# Patient Record
Sex: Female | Born: 1940 | Race: Black or African American | Hispanic: No | Marital: Single | Smoking: Former smoker
Health system: Southern US, Community
[De-identification: ages and names within clinical notes are randomized; demographics above are authoritative.]

## PROBLEM LIST (undated history)

## (undated) DIAGNOSIS — I10 Essential (primary) hypertension: Secondary | ICD-10-CM

## (undated) DIAGNOSIS — E119 Type 2 diabetes mellitus without complications: Secondary | ICD-10-CM

## (undated) HISTORY — PX: BLADDER SURGERY: SHX569

## (undated) HISTORY — PX: ABDOMINAL HYSTERECTOMY: SHX81

---

## 2004-03-17 ENCOUNTER — Other Ambulatory Visit: Payer: Self-pay

## 2004-03-17 ENCOUNTER — Emergency Department: Payer: Self-pay | Admitting: Emergency Medicine

## 2006-12-13 ENCOUNTER — Ambulatory Visit: Payer: Self-pay | Admitting: Family Medicine

## 2007-12-15 ENCOUNTER — Ambulatory Visit: Payer: Self-pay | Admitting: Family Medicine

## 2008-07-24 ENCOUNTER — Ambulatory Visit: Payer: Self-pay | Admitting: Obstetrics and Gynecology

## 2008-07-30 ENCOUNTER — Inpatient Hospital Stay: Payer: Self-pay | Admitting: Obstetrics and Gynecology

## 2009-03-13 ENCOUNTER — Ambulatory Visit: Payer: Self-pay | Admitting: Family Medicine

## 2010-02-19 ENCOUNTER — Ambulatory Visit: Payer: Self-pay | Admitting: Gastroenterology

## 2014-08-25 ENCOUNTER — Emergency Department
Admission: EM | Admit: 2014-08-25 | Discharge: 2014-08-25 | Disposition: A | Discharge status: Home / Self Care | Payer: Medicare HMO | Attending: Emergency Medicine | Admitting: Emergency Medicine

## 2014-08-25 ENCOUNTER — Encounter: Payer: Self-pay | Admitting: Emergency Medicine

## 2014-08-25 DIAGNOSIS — I10 Essential (primary) hypertension: Secondary | ICD-10-CM | POA: Insufficient documentation

## 2014-08-25 DIAGNOSIS — Z79899 Other long term (current) drug therapy: Secondary | ICD-10-CM | POA: Diagnosis not present

## 2014-08-25 DIAGNOSIS — I119 Hypertensive heart disease without heart failure: Secondary | ICD-10-CM | POA: Diagnosis not present

## 2014-08-25 DIAGNOSIS — M791 Myalgia: Secondary | ICD-10-CM | POA: Diagnosis present

## 2014-08-25 DIAGNOSIS — R21 Rash and other nonspecific skin eruption: Secondary | ICD-10-CM | POA: Diagnosis not present

## 2014-08-25 DIAGNOSIS — Z792 Long term (current) use of antibiotics: Secondary | ICD-10-CM | POA: Insufficient documentation

## 2014-08-25 DIAGNOSIS — Z87891 Personal history of nicotine dependence: Secondary | ICD-10-CM | POA: Insufficient documentation

## 2014-08-25 HISTORY — DX: Essential (primary) hypertension: I10

## 2014-08-25 HISTORY — DX: Type 2 diabetes mellitus without complications: E11.9

## 2014-08-25 MED ORDER — MUPIROCIN CALCIUM 2 % EX CREA: TOPICAL_CREAM | CUTANEOUS | Status: DC

## 2014-08-25 MED ORDER — SULFAMETHOXAZOLE-TRIMETHOPRIM 800-160 MG PO TABS: 1.0000 | ORAL_TABLET | Freq: Two times a day (BID) | ORAL | Status: DC

## 2014-08-25 NOTE — ED Provider Notes (Signed)
[facility] Emergency Department Provider Note  ____________________________________________  Time seen: Approximately 10:32 AM  I have reviewed the triage vital signs and the nursing notes.   HISTORY  Chief Complaint Wound Check    HPI Jaime Castro is a 74 y.o. female is here today with complaint of a sore around her buttocks. She states that she has an area that was hurting and used a "pain patch" that was prescribed for her for another part of her body. She states this is probably a bad idea. After that she has noticed a sore that is open for approximately 1 week. Pain currently is a 10 out of 10. Rubbing it makes it worse. She has not found anything that makes it feel better. She denies any fever chills nausea vomiting.   Past Medical History  Diagnosis Date  . Hypertension   . Diabetes mellitus without complication     There are no active problems to display for this patient.   No past surgical history on file.  Current Outpatient Rx  Name  Route  Sig  Dispense  Refill  . amLODipine (NORVASC) 10 MG tablet   Oral   Take 10 mg by mouth daily.         Marland Kitchen atorvastatin (LIPITOR) 80 MG tablet   Oral   Take 80 mg by mouth daily.         . cholecalciferol (VITAMIN D) 1000 UNITS tablet   Oral   Take 1,000 Units by mouth daily.         Marland Kitchen lisinopril-hydrochlorothiazide (PRINZIDE,ZESTORETIC) 20-25 MG per tablet   Oral   Take 1 tablet by mouth daily.         . metFORMIN (GLUMETZA) 500 MG (MOD) 24 hr tablet   Oral   Take 500 mg by mouth 2 (two) times daily with a meal.         . mupirocin cream (BACTROBAN) 2 %      Apply to affected area 3 times daily   30 g   0   . sulfamethoxazole-trimethoprim (BACTRIM DS,SEPTRA DS) 800-160 MG per tablet   Oral   Take 1 tablet by mouth 2 (two) times daily.   20 tablet   0     Allergies Review of patient's allergies indicates no known allergies.  No family history on file.  Social  History History  Substance Use Topics  . Smoking status: Former Games developer  . Smokeless tobacco: Not on file  . Alcohol Use: No    Review of Systems Constitutional: No fever/chills Eyes: No visual changes. ENT: No sore throat. Cardiovascular: Denies chest pain. Respiratory: Denies shortness of breath. Gastrointestinal: No abdominal pain.  No nausea, no vomiting.  No diarrhea.  No constipation. Genitourinary: Negative for dysuria. Musculoskeletal: Negative for back pain. Skin: Negative for rash. Neurological: Negative for headaches, focal weakness or numbness.  10-point ROS otherwise negative.  ____________________________________________   PHYSICAL EXAM:  VITAL SIGNS: ED Triage Vitals  Enc Vitals Group     BP 08/25/14 1004 164/70 mmHg     Pulse Rate 08/25/14 1004 70     Resp 08/25/14 1004 20     Temp 08/25/14 1004 97.7 F (36.5 C)     Temp Source 08/25/14 1004 Oral     SpO2 08/25/14 1004 99 %     Weight 08/25/14 1004 145 lb (65.772 kg)     Height 08/25/14 1004  (1.575 m)     Head Cir --  Peak Flow --      Pain Score 08/25/14 1005 10     Pain Loc --      Pain Edu? --      Excl. in GC? --     Constitutional: Alert and oriented. Well appearing and in no acute distress. Eyes: Conjunctivae are normal. PERRL. EOMI. Head: Atraumatic. Nose: No congestion/rhinnorhea. Cardiovascular: Normal rate, regular rhythm. Grossly normal heart sounds.  Good peripheral circulation. Respiratory: Normal respiratory effort.  No retractions. Lungs CTAB. Gastrointestinal: Soft and nontender. No distention. No abdominal bruits. No CVA tenderness. Musculoskeletal: No lower extremity tenderness nor edema.  No joint effusions. Neurologic:  Normal speech and language. No gross focal neurologic deficits are appreciated. Speech is normal. No gait instability. Skin:  Skin is warm, dry. There is 2 small open areas approximately 1 cm on the right upper buttocks. There is no drainage. These  are very superficial in appearance. There is no tenderness on palpation, no warmth or sensation of a cyst. Psychiatric: Mood and affect are normal. Speech and behavior are normal.  ____________________________________________   LABS (all labs ordered are listed, but only abnormal results are displayed)  Labs Reviewed - No data to display PROCEDURES  Procedure(s) performed: None  Critical Care performed: No  ____________________________________________   INITIAL IMPRESSION / ASSESSMENT AND PLAN / ED COURSE  Pertinent labs & imaging results that were available during my care of the patient were reviewed by me and considered in my medical decision making (see chart for details).  Patient was given a prescription for Septra DS and also Bactroban. She is to follow-up with her doctor next week or return to the emergency room sooner if any severe worsening. ____________________________________________   FINAL CLINICAL IMPRESSION(S) / ED DIAGNOSES  Final diagnoses:  Skin eruption      Tommi RumpsRhonda L Summers, PA-C 08/25/14 1050  Jene Everyobert Kinner, MD 08/25/14 1423

## 2014-08-25 NOTE — ED Notes (Signed)
States has had sore by coccyx x 1 week, noted small open sore, states has been on both sides

## 2015-11-04 ENCOUNTER — Other Ambulatory Visit: Payer: Self-pay | Admitting: Family Medicine

## 2015-11-04 DIAGNOSIS — Z1231 Encounter for screening mammogram for malignant neoplasm of breast: Secondary | ICD-10-CM

## 2015-11-20 ENCOUNTER — Other Ambulatory Visit: Payer: Self-pay | Admitting: Family Medicine

## 2015-11-20 ENCOUNTER — Ambulatory Visit
Admission: RE | Admit: 2015-11-20 | Discharge: 2015-11-20 | Disposition: A | Discharge status: Home / Self Care | Payer: Medicare HMO | Source: Ambulatory Visit | Attending: Family Medicine | Admitting: Family Medicine

## 2015-11-20 DIAGNOSIS — Z1231 Encounter for screening mammogram for malignant neoplasm of breast: Secondary | ICD-10-CM | POA: Diagnosis present

## 2017-11-21 ENCOUNTER — Emergency Department
Admission: EM | Admit: 2017-11-21 | Discharge: 2017-11-21 | Disposition: A | Discharge status: Home / Self Care | Payer: Medicare HMO | Attending: Student in an Organized Health Care Education/Training Program | Admitting: Student in an Organized Health Care Education/Training Program

## 2017-11-21 ENCOUNTER — Other Ambulatory Visit: Payer: Self-pay

## 2017-11-21 ENCOUNTER — Emergency Department: Payer: Medicare HMO

## 2017-11-21 DIAGNOSIS — Z79899 Other long term (current) drug therapy: Secondary | ICD-10-CM | POA: Diagnosis not present

## 2017-11-21 DIAGNOSIS — Z7984 Long term (current) use of oral hypoglycemic drugs: Secondary | ICD-10-CM | POA: Insufficient documentation

## 2017-11-21 DIAGNOSIS — Z87891 Personal history of nicotine dependence: Secondary | ICD-10-CM | POA: Diagnosis not present

## 2017-11-21 DIAGNOSIS — M25552 Pain in left hip: Secondary | ICD-10-CM | POA: Diagnosis not present

## 2017-11-21 DIAGNOSIS — I10 Essential (primary) hypertension: Secondary | ICD-10-CM | POA: Insufficient documentation

## 2017-11-21 DIAGNOSIS — E119 Type 2 diabetes mellitus without complications: Secondary | ICD-10-CM | POA: Insufficient documentation

## 2017-11-21 MED ORDER — MELOXICAM 15 MG PO TABS: 15.0000 mg | ORAL_TABLET | Freq: Every day | ORAL | 2 refills | Status: AC

## 2017-11-21 MED ORDER — KETOROLAC TROMETHAMINE 30 MG/ML IJ SOLN
30.0000 mg | Freq: Once | INTRAMUSCULAR | Status: AC
  Administered 2017-11-21: 30 mg via INTRAMUSCULAR
  Filled 2017-11-21: qty 1

## 2017-11-21 NOTE — Discharge Instructions (Addendum)
Follow-up with Dr. Martha ClanKrasinski if not better 5 7 days.  Return emergency department for worsening.  Take medication as needed for pain.  You may also apply heat or ice to the area.

## 2017-11-21 NOTE — ED Triage Notes (Signed)
Pt arrives to ED c/o of L hip and L leg pain that began last night. Denies hx sciatica. Alert, oriented, ambulatory.

## 2017-11-21 NOTE — ED Provider Notes (Signed)
[facility] Emergency Department Provider Note  ____________________________________________   First MD Initiated Contact with Patient 11/21/17 1516     (approximate)  I have reviewed the triage vital signs and the nursing notes.   HISTORY  Chief Complaint Hip Pain and Leg Pain    HPI Jaime Castro is a 77 y.o. female complaining of left hip pain and leg pain.  States pain began last night.  She denies any injuries.   No history of leg or back pain.  Patient is unsure if she has arthritis or not.  She denies numbness or tingling   Past Medical History:  Diagnosis Date  . Diabetes mellitus without complication (HCC)   . Hypertension     There are no active problems to display for this patient.   Past Surgical History:  Procedure Laterality Date  . ABDOMINAL HYSTERECTOMY    . BLADDER SURGERY      Prior to Admission medications   Medication Sig Start Date End Date Taking? Authorizing Provider  amLODipine (NORVASC) 10 MG tablet Take 10 mg by mouth daily.    [provider]  atorvastatin (LIPITOR) 80 MG tablet Take 80 mg by mouth daily.    [provider]  cholecalciferol (VITAMIN D) 1000 UNITS tablet Take 1,000 Units by mouth daily.    [provider]  lisinopril-hydrochlorothiazide (PRINZIDE,ZESTORETIC) 20-25 MG per tablet Take 1 tablet by mouth daily.    [provider]  meloxicam (MOBIC) 15 MG tablet Take 1 tablet (15 mg total) by mouth daily. 11/21/17 11/21/18  Bonnita Newby, Roselyn BeringSusan W, PA-C  metFORMIN (GLUMETZA) 500 MG (MOD) 24 hr tablet Take 500 mg by mouth 2 (two) times daily with a meal.    [provider]  mupirocin cream (BACTROBAN) 2 % Apply to affected area 3 times daily 08/25/14   Tommi RumpsSummers, Rhonda L, PA-C  sulfamethoxazole-trimethoprim (BACTRIM DS,SEPTRA DS) 800-160 MG per tablet Take 1 tablet by mouth 2 (two) times daily. 08/25/14   Tommi RumpsSummers, Rhonda L, PA-C    Allergies Patient has no known  allergies.  History reviewed. No pertinent family history.  Social History Social History   Tobacco Use  . Smoking status: Former Smoker  Substance Use Topics  . Alcohol use: No  . Drug use: Not on file    Review of Systems  Constitutional: No fever/chills Eyes: No visual changes. ENT: No sore throat. Respiratory: Denies cough Genitourinary: Negative for dysuria. Musculoskeletal: Negative for back pain.  Positive left hip and leg pain Skin: Negative for rash.    ____________________________________________   PHYSICAL EXAM:  VITAL SIGNS: ED Triage Vitals  Enc Vitals Group     BP 11/21/17 1430 (!) 130/104     Pulse Rate 11/21/17 1430 71     Resp 11/21/17 1430 16     Temp 11/21/17 1430 98.4 F (36.9 C)     Temp Source 11/21/17 1430 Oral     SpO2 11/21/17 1430 99 %     Weight 11/21/17 1431 145 lb (65.8 kg)     Height 11/21/17 1431 5\' 3"  (1.6 m)     Head Circumference --      Peak Flow --      Pain Score 11/21/17 1430 9     Pain Loc --      Pain Edu? --      Excl. in GC? --     Constitutional: Alert and oriented. Well appearing and in no acute distress. Eyes: Conjunctivae are normal.  Head: Atraumatic.  Nose: No congestion/rhinnorhea. Mouth/Throat: Mucous membranes are moist.   Neck:  supple no lymphadenopathy noted Cardiovascular: Normal rate, regular rhythm. Heart sounds are normal Respiratory: Normal respiratory effort.  No retractions, lungs c t a  GU: deferred Musculoskeletal: FROM all extremities, warm and well perfused.  Left posterior hip is tender to palpation.  The spine is not tender.  Patient does walk a little bit with a limp.  The knee is not tender.  She is neurovascularly intact. Neurologic:  Normal speech and language.  Skin:  Skin is warm, dry and intact. No rash noted. Psychiatric: Mood and affect are normal. Speech and behavior are normal.  ____________________________________________   LABS (all labs ordered are listed, but only  abnormal results are displayed)  Labs Reviewed - No data to display ____________________________________________   ____________________________________________  RADIOLOGY  X-ray of the left hip is negative for any acute injury but there appears to have narrowing of both hip joints  ____________________________________________   PROCEDURES  Procedure(s) performed: Toradol 30 mg IM  Procedures    ____________________________________________   INITIAL IMPRESSION / ASSESSMENT AND PLAN / ED COURSE  Pertinent labs & imaging results that were available during my care of the patient were reviewed by me and considered in my medical decision making (see chart for details).   Patient is a 77 year old female presents emergency department complaining of left hip pain.  She states the pain started last night.  States pain radiates to her knee.  She denies any injury.  She denies back pain.  Physical exam patient appears well.  She is able to ambulate without difficulty.  The posterior left hip is tender to palpation.  The knee is not tender.  She has full range of motion and is neurovascularly intact.  XRay of the left hip is negative  Discussed x-ray results with the patient.  She was given a injection of Toradol 30 mg IM.  She was given a prescription for meloxicam 15 mg daily to take as needed.  She is to follow-up with orthopedics if not improving in 5 to 7 days.  Apply ice or heat to the left hip.  Return emergency department if worsening.  States she understands will comply.  Was discharged in stable condition     As part of my medical decision making, I reviewed the following data within the electronic MEDICAL RECORD NUMBER Nursing notes reviewed and incorporated, Old chart reviewed, Radiograph reviewed x-ray of the left hip is negative, Notes from prior ED visits and [place] Controlled Substance Database  ____________________________________________   FINAL CLINICAL IMPRESSION(S) / ED  DIAGNOSES  Final diagnoses:  Left hip pain      NEW MEDICATIONS STARTED DURING THIS VISIT:  New Prescriptions   MELOXICAM (MOBIC) 15 MG TABLET    Take 1 tablet (15 mg total) by mouth daily.     Note:  This document was prepared using Dragon voice recognition software and may include unintentional dictation errors.    Faythe GheeFisher, Basir Niven W, PA-C 11/21/17 1618    Willy Eddyobinson, Patrick, MD 11/23/17 1045

## 2019-01-30 ENCOUNTER — Encounter (INDEPENDENT_AMBULATORY_CARE_PROVIDER_SITE_OTHER): Payer: Self-pay | Admitting: Vascular Surgery

## 2019-01-30 ENCOUNTER — Encounter (INDEPENDENT_AMBULATORY_CARE_PROVIDER_SITE_OTHER): Payer: Self-pay

## 2019-01-30 ENCOUNTER — Other Ambulatory Visit: Payer: Self-pay

## 2019-01-30 ENCOUNTER — Ambulatory Visit (INDEPENDENT_AMBULATORY_CARE_PROVIDER_SITE_OTHER): Payer: Medicare Other | Admitting: Vascular Surgery

## 2019-01-30 DIAGNOSIS — M8949 Other hypertrophic osteoarthropathy, multiple sites: Secondary | ICD-10-CM

## 2019-01-30 DIAGNOSIS — I10 Essential (primary) hypertension: Secondary | ICD-10-CM | POA: Diagnosis not present

## 2019-01-30 DIAGNOSIS — M159 Polyosteoarthritis, unspecified: Secondary | ICD-10-CM

## 2019-01-30 DIAGNOSIS — E782 Mixed hyperlipidemia: Secondary | ICD-10-CM | POA: Diagnosis not present

## 2019-01-30 DIAGNOSIS — M79606 Pain in leg, unspecified: Secondary | ICD-10-CM | POA: Insufficient documentation

## 2019-01-30 DIAGNOSIS — M1990 Unspecified osteoarthritis, unspecified site: Secondary | ICD-10-CM | POA: Insufficient documentation

## 2019-01-30 DIAGNOSIS — E785 Hyperlipidemia, unspecified: Secondary | ICD-10-CM | POA: Insufficient documentation

## 2019-01-30 DIAGNOSIS — M79604 Pain in right leg: Secondary | ICD-10-CM

## 2019-01-30 DIAGNOSIS — M79605 Pain in left leg: Secondary | ICD-10-CM

## 2019-01-30 NOTE — Progress Notes (Signed)
MRN : [id number]  Jaime Castro is a 78 y.o. ([birth year]) female who presents with chief complaint of  Chief Complaint  Patient presents with  . New Patient (Initial Visit)    ref Gauger for PAD  .  History of Present Illness:   The patient is seen for evaluation of painful lower extremities. Patient notes the pain is variable and not always associated with activity.  The pain is somewhat consistent day to day occurring on most days. The patient notes the pain also occurs with standing and routinely seems worse as the day wears on. The pain has been progressive over the past several years. The patient states these symptoms are causing  a profound negative impact on quality of life and daily activities.  The patient denies rest pain or dangling of an extremity off the side of the bed during the night for relief. No open wounds or sores at this time. No history of DVT or phlebitis. No prior interventions or surgeries.  There is a  history of back problems and DJD of multiple joints.    Current Meds  Medication Sig  . amLODipine (NORVASC) 10 MG tablet Take 10 mg by mouth daily.  Marland Kitchen aspirin EC 81 MG tablet Take by mouth.  Marland Kitchen atorvastatin (LIPITOR) 80 MG tablet Take 80 mg by mouth daily.  . cholecalciferol (VITAMIN D) 1000 UNITS tablet Take 1,000 Units by mouth daily.  . COD LIVER OIL PO Take by mouth.  Marland Kitchen glucose blood test strip USE AS DIRECTED TO CHECK BLOOD SUGAR  . lisinopril-hydrochlorothiazide (PRINZIDE,ZESTORETIC) 20-25 MG per tablet Take 1 tablet by mouth daily.  . metFORMIN (GLUMETZA) 500 MG (MOD) 24 hr tablet Take 500 mg by mouth 2 (two) times daily with a meal.    Past Medical History:  Diagnosis Date  . Diabetes mellitus without complication ([place])   . Hypertension     Past Surgical History:  Procedure Laterality Date  . ABDOMINAL HYSTERECTOMY    . BLADDER SURGERY      Social History Social History   Tobacco Use  . Smoking status: Former Research scientist (life sciences)  .  Smokeless tobacco: Never Used  Substance Use Topics  . Alcohol use: No  . Drug use: Never    Family History Family History  Problem Relation Age of Onset  . Hypertension Mother   . Alcohol abuse Father   . Diabetes Maternal Aunt   . Cancer Maternal Grandfather   No family history of bleeding/clotting disorders, porphyria or autoimmune disease   Allergies  Allergen Reactions  . Meloxicam     Other reaction(s): Other (See Comments) Causes HTN     REVIEW OF SYSTEMS (Negative unless checked)  Constitutional: [] Weight loss  [] Fever  [] Chills Cardiac: [] Chest pain   [] Chest pressure   [] Palpitations   [] Shortness of breath when laying flat   [] Shortness of breath with exertion. Vascular:  [] Pain in legs with walking   [] Pain in legs at rest  [] History of DVT   [] Phlebitis   [] Swelling in legs   [] Varicose veins   [] Non-healing ulcers Pulmonary:   [] Uses home oxygen   [] Productive cough   [] Hemoptysis   [] Wheeze  [] COPD   [] Asthma Neurologic:  [] Dizziness   [] Seizures   [] History of stroke   [] History of TIA  [] Aphasia   [] Vissual changes   [] Weakness or numbness in arm   [] Weakness or numbness in leg Musculoskeletal:   [x] Joint swelling   [x] Joint pain   [] Low back pain Hematologic:  []   Easy bruising  [] Easy bleeding   [] Hypercoagulable state   [] Anemic Gastrointestinal:  [] Diarrhea   [] Vomiting  [] Gastroesophageal reflux/heartburn   [] Difficulty swallowing. Genitourinary:  [] Chronic kidney disease   [] Difficult urination  [] Frequent urination   [] Blood in urine Skin:  [] Rashes   [] Ulcers  Psychological:  [] History of anxiety   []  History of major depression.  Physical Examination  Vitals:   01/30/19 1312  BP: (!) 161/85  Pulse: 77  Resp: 16  Weight: 140 lb 3.2 oz (63.6 kg)  Height: 5\' 2"  (1.575 m)   Body mass index is 25.64 kg/m. Gen: WD/WN, NAD Head: [place]/AT, No temporalis wasting.  Ear/Nose/Throat: Hearing grossly intact, nares w/o erythema or drainage, poor dentition  Eyes: PER, EOMI, sclera nonicteric.  Neck: Supple, no masses.  No bruit or JVD.  Pulmonary:  Good air movement, clear to auscultation bilaterally, no use of accessory muscles.  Cardiac: RRR, normal S1, S2, no Murmurs. Vascular:  Vessel Right Left  Radial Palpable Palpable  Carotid Palpable Palpable  PT Palpable Palpable  DP Trace Palpable Palpable  Gastrointestinal: soft, non-distended. No guarding/no peritoneal signs.  Musculoskeletal: M/S 5/5 throughout.  Multiple joints with arthritic deformity.  Neurologic: CN 2-12 intact. Pain and light touch intact in extremities.  Symmetrical.  Speech is fluent. Motor exam as listed above. Psychiatric: Judgment intact, Mood & affect appropriate for pt's clinical situation. Dermatologic: No rashes or ulcers noted.  No changes consistent with cellulitis. Lymph : No Cervical lymphadenopathy, no lichenification or skin changes of chronic lymphedema.  CBC No results found for: WBC, HGB, HCT, MCV, PLT  BMET No results found for: NA, K, CL, CO2, GLUCOSE, BUN, CREATININE, CALCIUM, GFRNONAA, GFRAA CrCl cannot be calculated (No successful lab value found.).  COAG No results found for: INR, PROTIME  Radiology No results found.   Assessment/Plan 1. Pain in both lower extremities Recommend:  I do not find evidence of Vascular pathology that would explain the patient's symptoms  The patient has atypical pain symptoms for vascular disease  I do not find evidence of Vascular pathology that would explain the patient's symptoms and I suspect the patient is c/o pseudoclaudication.  Patient should have an evaluation of his LS spine which I defer to the primary service.  Noninvasive studies including venous ultrasound of the legs do not identify vascular problems  The patient should continue walking and begin a more formal exercise program. The patient should continue his antiplatelet therapy and aggressive treatment of the lipid abnormalities. The  patient should begin wearing graduated compression socks 15-20 mmHg strength to control her mild edema.  Patient will follow-up with me on a PRN basis  Further work-up of her lower extremity pain is deferred to the primary service     2. Primary osteoarthritis involving multiple joints Recommend:  I do not find evidence of Vascular pathology that would explain the patient's symptoms  The patient has atypical pain symptoms for vascular disease  I do not find evidence of Vascular pathology that would explain the patient's symptoms and I suspect the patient is c/o pseudoclaudication.  Patient should have an evaluation of his LS spine which I defer to the primary service.  Noninvasive studies including venous ultrasound of the legs do not identify vascular problems  The patient should continue walking and begin a more formal exercise program. The patient should continue his antiplatelet therapy and aggressive treatment of the lipid abnormalities. The patient should begin wearing graduated compression socks 15-20 mmHg strength to control her mild edema.  Patient will follow-up with me on a PRN basis  Further work-up of her lower extremity pain is deferred to the primary service     3. Essential hypertension Continue antihypertensive medications as already ordered, these medications have been reviewed and there are no changes at this time.   4. Mixed hyperlipidemia Continue statin as ordered and reviewed, no changes at this time     Levora Dredge, MD  01/30/2019 1:40 PM

## 2019-03-23 ENCOUNTER — Encounter: Payer: Self-pay | Admitting: Emergency Medicine

## 2019-03-23 ENCOUNTER — Emergency Department: Payer: Medicare Other

## 2019-03-23 ENCOUNTER — Other Ambulatory Visit: Payer: Self-pay

## 2019-03-23 ENCOUNTER — Emergency Department
Admission: EM | Admit: 2019-03-23 | Discharge: 2019-03-23 | Disposition: A | Discharge status: Home / Self Care | Payer: Medicare Other | Attending: Emergency Medicine | Admitting: Emergency Medicine

## 2019-03-23 DIAGNOSIS — Z7982 Long term (current) use of aspirin: Secondary | ICD-10-CM | POA: Diagnosis not present

## 2019-03-23 DIAGNOSIS — Z79899 Other long term (current) drug therapy: Secondary | ICD-10-CM | POA: Diagnosis not present

## 2019-03-23 DIAGNOSIS — M70872 Other soft tissue disorders related to use, overuse and pressure, left ankle and foot: Secondary | ICD-10-CM | POA: Insufficient documentation

## 2019-03-23 DIAGNOSIS — I10 Essential (primary) hypertension: Secondary | ICD-10-CM | POA: Insufficient documentation

## 2019-03-23 DIAGNOSIS — Y939 Activity, unspecified: Secondary | ICD-10-CM | POA: Diagnosis not present

## 2019-03-23 DIAGNOSIS — Z87891 Personal history of nicotine dependence: Secondary | ICD-10-CM | POA: Insufficient documentation

## 2019-03-23 DIAGNOSIS — R52 Pain, unspecified: Secondary | ICD-10-CM

## 2019-03-23 DIAGNOSIS — M79672 Pain in left foot: Secondary | ICD-10-CM | POA: Diagnosis present

## 2019-03-23 DIAGNOSIS — M779 Enthesopathy, unspecified: Secondary | ICD-10-CM

## 2019-03-23 DIAGNOSIS — E119 Type 2 diabetes mellitus without complications: Secondary | ICD-10-CM | POA: Diagnosis not present

## 2019-03-23 MED ORDER — IBUPROFEN 400 MG PO TABS: 400.0000 mg | ORAL_TABLET | Freq: Four times a day (QID) | ORAL | 0 refills | Status: AC | PRN

## 2019-03-23 MED ORDER — IBUPROFEN 400 MG PO TABS: 400.0000 mg | ORAL_TABLET | Freq: Four times a day (QID) | ORAL | 0 refills | Status: DC | PRN

## 2019-03-23 MED ORDER — CEPHALEXIN 500 MG PO CAPS: 500.0000 mg | ORAL_CAPSULE | Freq: Four times a day (QID) | ORAL | 0 refills | Status: AC

## 2019-03-23 MED ORDER — CEPHALEXIN 500 MG PO CAPS: 500.0000 mg | ORAL_CAPSULE | Freq: Four times a day (QID) | ORAL | 0 refills | Status: DC

## 2019-03-23 NOTE — ED Triage Notes (Signed)
Pain and swelling left ankle foot area with redness.  Has not injured.

## 2019-03-23 NOTE — Discharge Instructions (Addendum)
Your x-ray does not show any fractures or arthritis.  I suspect that you have tendinitis in your Achilles tendon.  Please continue to ice ankle and take ibuprofen.  I have given you a prescription for antibiotics in case you are starting to develop a bacterial skin infection however.

## 2019-03-23 NOTE — ED Provider Notes (Signed)
[facility] Emergency Department Provider Note  ____________________________________________  Time seen: Approximately 10:14 AM  I have reviewed the triage vital signs and the nursing notes.   HISTORY  Chief Complaint Foot Pain    HPI Jaime Castro is a 78 y.o. female that presents to the emergency department for evaluation of left posterior ankle pain and swelling for 2 days.  Area is painful to the back of her ankle and slightly to both sides of her ankle.  Patient is able to move her ankle fully.  She denies any trauma.  No wounds.  This is never happened before.  She has no history of gout.  No fevers.  She has a history of diabetes.   Past Medical History:  Diagnosis Date  . Diabetes mellitus without complication (HCC)   . Hypertension     Patient Active Problem List   Diagnosis Date Noted  . Leg pain 01/30/2019  . DJD (degenerative joint disease) 01/30/2019  . Essential hypertension 01/30/2019  . Hyperlipidemia 01/30/2019    Past Surgical History:  Procedure Laterality Date  . ABDOMINAL HYSTERECTOMY    . BLADDER SURGERY      Prior to Admission medications   Medication Sig Start Date End Date Taking? Authorizing Provider  amLODipine (NORVASC) 10 MG tablet Take 10 mg by mouth daily.    [provider]  aspirin EC 81 MG tablet Take by mouth.    [provider]  atorvastatin (LIPITOR) 80 MG tablet Take 80 mg by mouth daily.    [provider]  cephALEXin (KEFLEX) 500 MG capsule Take 1 capsule (500 mg total) by mouth 4 (four) times daily for 10 days. 03/23/19 04/02/19  Enid DerryWagner, Jaymason Ledesma, PA-C  cholecalciferol (VITAMIN D) 1000 UNITS tablet Take 1,000 Units by mouth daily.    [provider]  COD LIVER OIL PO Take by mouth.    [provider]  glucose blood test strip USE AS DIRECTED TO CHECK BLOOD SUGAR 06/09/18   [provider]  ibuprofen (ADVIL) 400 MG tablet Take 1 tablet (400 mg total) by  mouth every 6 (six) hours as needed. 03/23/19   [place] DerryWagner, Ardith Lewman, PA-C  lisinopril-hydrochlorothiazide (PRINZIDE,ZESTORETIC) 20-25 MG per tablet Take 1 tablet by mouth daily.    [provider]  metFORMIN (GLUMETZA) 500 MG (MOD) 24 hr tablet Take 500 mg by mouth 2 (two) times daily with a meal.    [provider]    Allergies Meloxicam  Family History  Problem Relation Age of Onset  . Hypertension Mother   . Alcohol abuse Father   . Diabetes Maternal Aunt   . Cancer Maternal Grandfather     Social History Social History   Tobacco Use  . Smoking status: Former Games developermoker  . Smokeless tobacco: Never Used  Substance Use Topics  . Alcohol use: No  . Drug use: Never     Review of Systems  Constitutional: No fever/chills Respiratory: No SOB. Gastrointestinal: No abdominal pain.  No nausea, no vomiting.  Musculoskeletal: Positive for ankle pain. Skin: Negative for abrasions, lacerations, ecchymosis. Positive for rash. Neurological: Negative for numbness or tingling   ____________________________________________   PHYSICAL EXAM:  VITAL SIGNS: ED Triage Vitals  Enc Vitals Group     BP 03/23/19 1010 (!) 154/72     Pulse Rate 03/23/19 1010 71     Resp 03/23/19 1010 14     Temp 03/23/19 1010 98.4 F (36.9 C)     Temp Source 03/23/19 1010 Oral  SpO2 03/23/19 1010 100 %     Weight 03/23/19 1011 145 lb (65.8 kg)     Height 03/23/19 1011 5\' 4"  (1.626 m)     Head Circumference --      Peak Flow --      Pain Score 03/23/19 1011 8     Pain Loc --      Pain Edu? --      Excl. in GC? --      Constitutional: Alert and oriented. Well appearing and in no acute distress. Eyes: Conjunctivae are normal. PERRL. EOMI. Head: Atraumatic. ENT:      Ears:      Nose: No congestion/rhinnorhea.      Mouth/Throat: Mucous membranes are moist.  Neck: No stridor.  Cardiovascular: Normal rate, regular rhythm.  Good peripheral circulation. Respiratory: Normal respiratory  effort without tachypnea or retractions. Lungs CTAB. Good air entry to the bases with no decreased or absent breath sounds. Musculoskeletal: Full range of motion to all extremities. No gross deformities appreciated. Full ROM of left ankle without pain. No significant swelling to ankle. No calf pain or tenderness.  Tenderness to palpation to inferior Achilles tendon. Neurologic:  Normal speech and language. No gross focal neurologic deficits are appreciated.  Skin:  Skin is warm, dry and intact. Mild erythema to posterior ankle overlying achilles tendon that extends into the medial inferior ankle below the medial malleolus.  No swelling or edema to the medial or lateral malleolus.  No edema. Psychiatric: Mood and affect are normal. Speech and behavior are normal. Patient exhibits appropriate insight and judgement.   ____________________________________________   LABS (all labs ordered are listed, but only abnormal results are displayed)  Labs Reviewed - No data to display ____________________________________________  EKG   ____________________________________________  RADIOLOGY 03/25/19, personally viewed and evaluated these images (plain radiographs) as part of my medical decision making, as well as reviewing the written report by the radiologist.  DG Ankle Complete Right  Result Date: 03/23/2019 CLINICAL DATA:  Right ankle swelling. EXAM: RIGHT ANKLE - COMPLETE 3+ VIEW COMPARISON:  No prior. FINDINGS: No acute bony or joint abnormality. No evidence of fracture. No evidence of dislocation. IMPRESSION: No acute abnormality. Electronically Signed   By: 03/25/2019  Register   On: 03/23/2019 12:00    ____________________________________________    PROCEDURES  Procedure(s) performed:    Procedures    Medications - No data to display   ____________________________________________   INITIAL IMPRESSION / ASSESSMENT AND PLAN / ED COURSE  Pertinent labs & imaging results  that were available during my care of the patient were reviewed by me and considered in my medical decision making (see chart for details).  Review of the [place] CSRS was performed in accordance of the [facility] prior to dispensing any controlled drugs.   Patient presented to emergency department for evaluation of ankle pain for a couple of days.  Vital signs and exam are reassuring.  Patient does have some mild swelling and erythema overlying the Achilles tendon. Symptoms and exam are most consistent with a tendinitis.  X-ray is negative for acute bony abnormalities.  Low suspicion for gout or septic joint.  Patient denies any history of gout.  Patient is afebrile.  She has full range of motion of the joint without pain.  Symptoms are not consistent with DVT.  She has no calf pain or diffuse swelling.  Although I have a low suspicion for cellulitis, patient will be covered for cellulitis with Keflex as a precaution.  Patient will be discharged home with prescriptions for keflex and ibuprofen.  Patient states that she can take ibuprofen without difficulty.  Patient is to follow up with podiatry and primary care as directed.  Referral is given to Dr. Luana Shu.  Patient is given ED precautions to return to the ED for any worsening or new symptoms.  Jaime Castro was evaluated in Emergency Department on 03/23/2019 for the symptoms described in the history of present illness. She was evaluated in the context of the global COVID-19 pandemic, which necessitated consideration that the patient might be at risk for infection with the SARS-CoV-2 virus that causes COVID-19. Institutional protocols and algorithms that pertain to the evaluation of patients at risk for COVID-19 are in a state of rapid change based on information released by regulatory bodies including the CDC and federal and state organizations. These policies and algorithms were followed during the patient's care in the  ED.   ____________________________________________  FINAL CLINICAL IMPRESSION(S) / ED DIAGNOSES  Final diagnoses:  Tendinitis      NEW MEDICATIONS STARTED DURING THIS VISIT:  ED Discharge Orders         Ordered    ibuprofen (ADVIL) 400 MG tablet  Every 6 hours PRN     03/23/19 1216    cephALEXin (KEFLEX) 500 MG capsule  4 times daily     03/23/19 1216              This chart was dictated using voice recognition software/Dragon. Despite best efforts to proofread, errors can occur which can change the meaning. Any change was purely unintentional.    Laban Emperor, PA-C 03/23/19 1533    Earleen Newport, MD 03/24/19 [id number]

## 2019-03-23 NOTE — ED Notes (Signed)
See triage note  Presents with left ankle swelling and some redness  Denies any injury  Ambulates with slight limp

## 2019-07-26 ENCOUNTER — Inpatient Hospital Stay: Admit: 2019-07-26 | Payer: Medicare Other

## 2021-05-05 IMAGING — DX DG ANKLE COMPLETE 3+V*R*
3 series · 3 of 3 positions shown · non-contrast
Comparison: No prior.

CLINICAL DATA: Right ankle swelling.

EXAM:
RIGHT ANKLE - COMPLETE 3+ VIEW

[ankle ap]
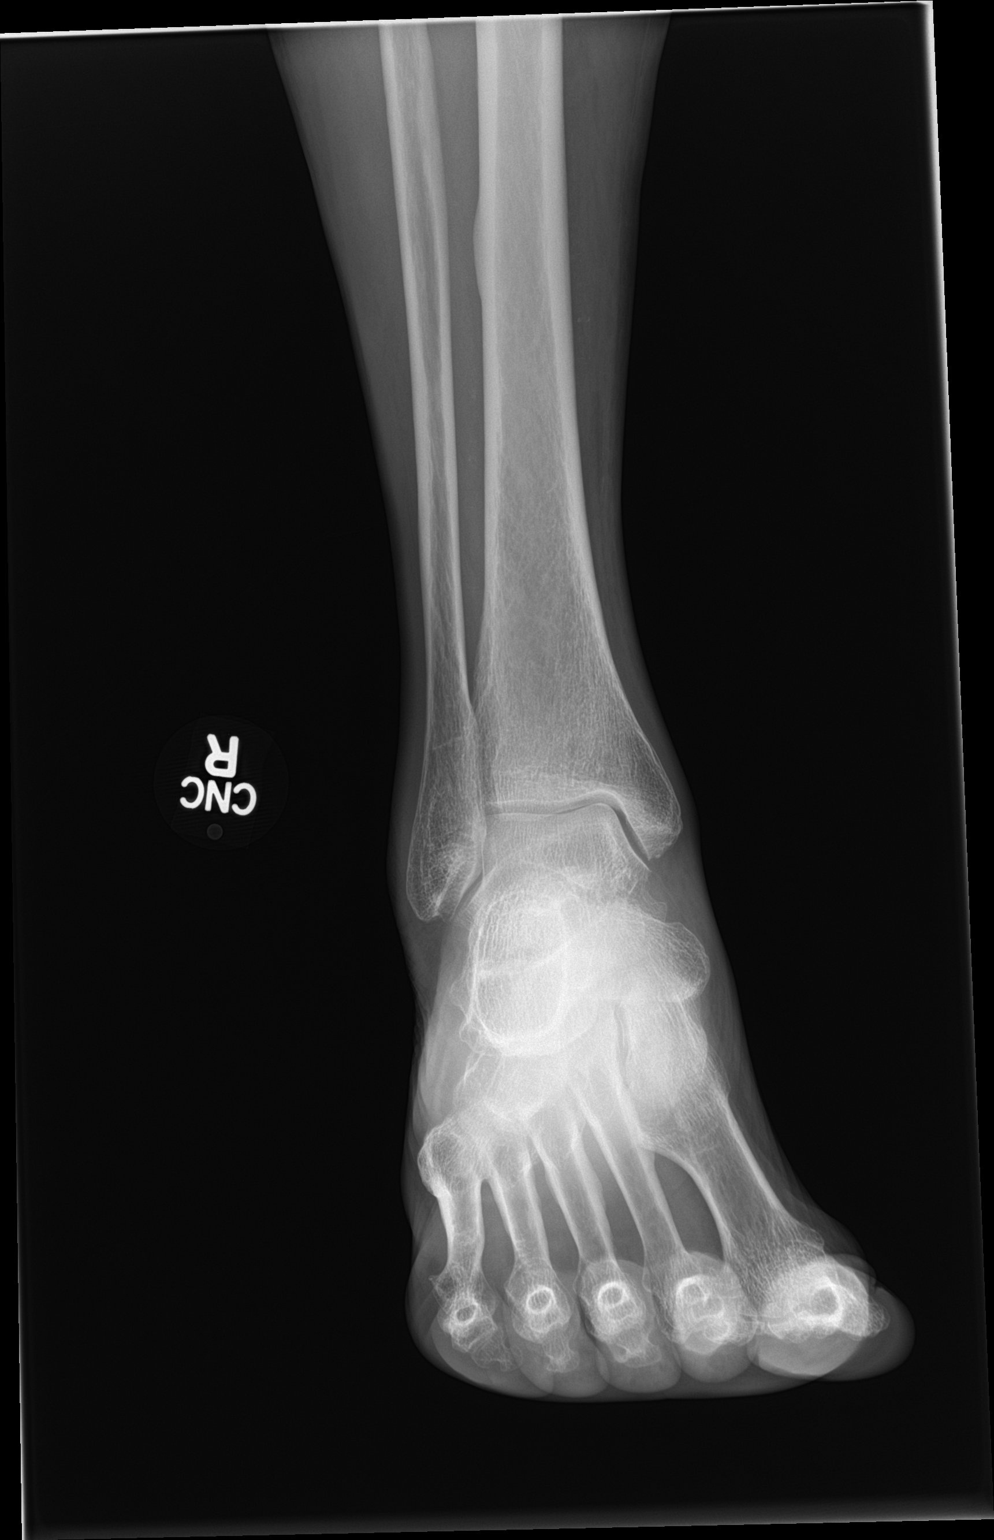

[ankle obl]
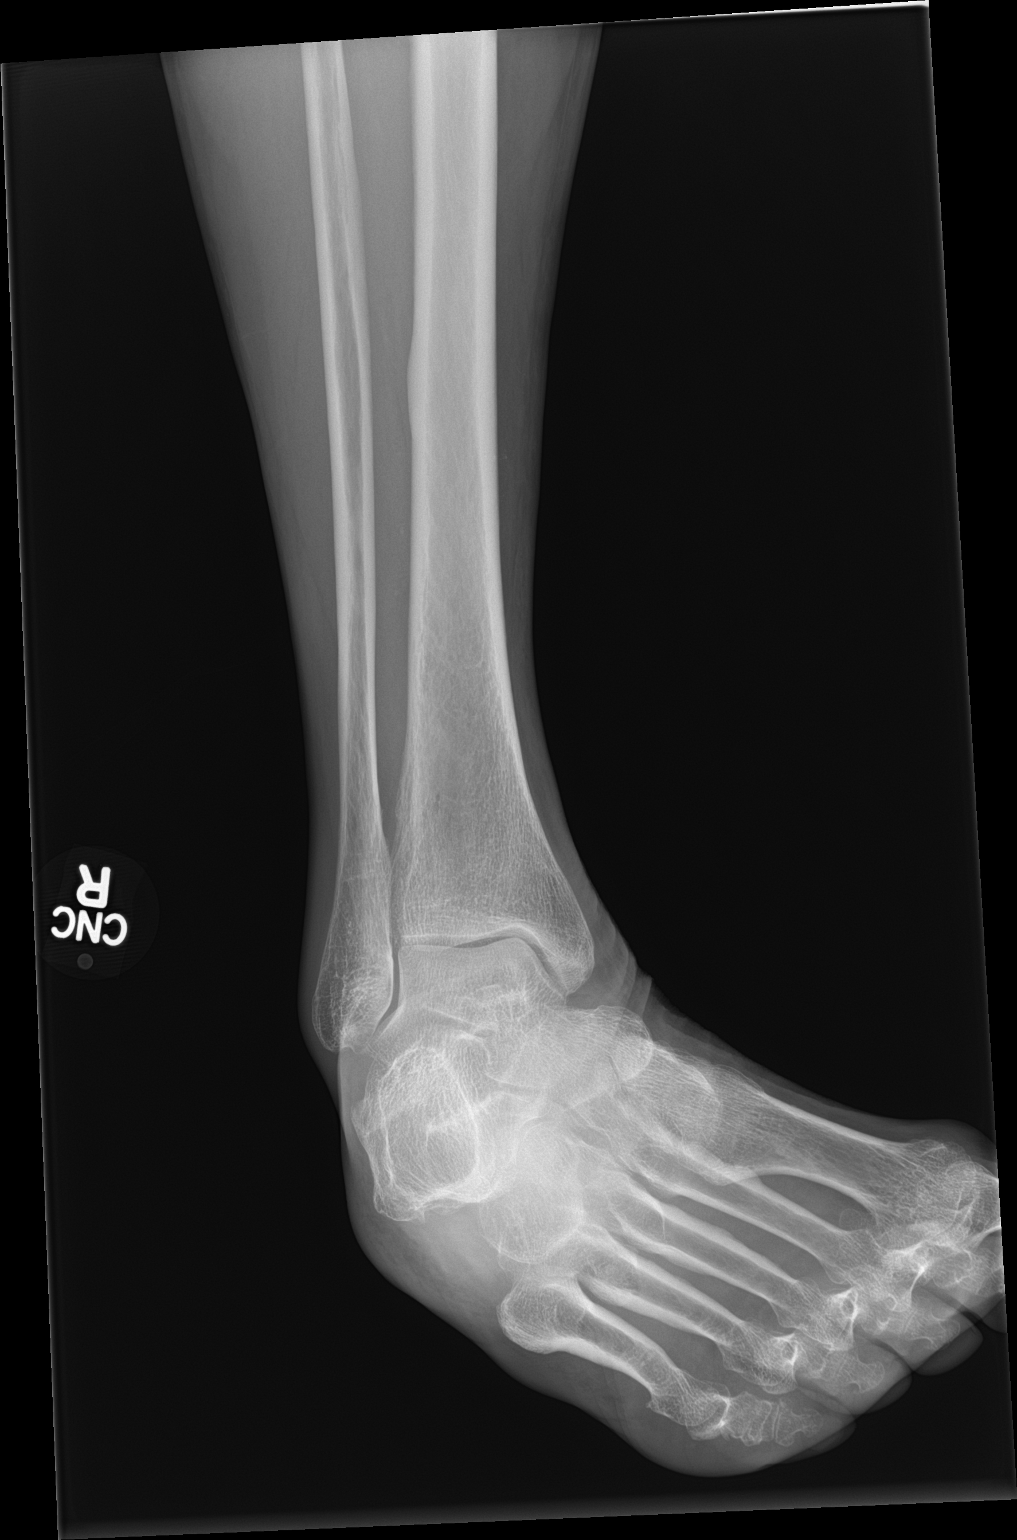

[ankle lat]
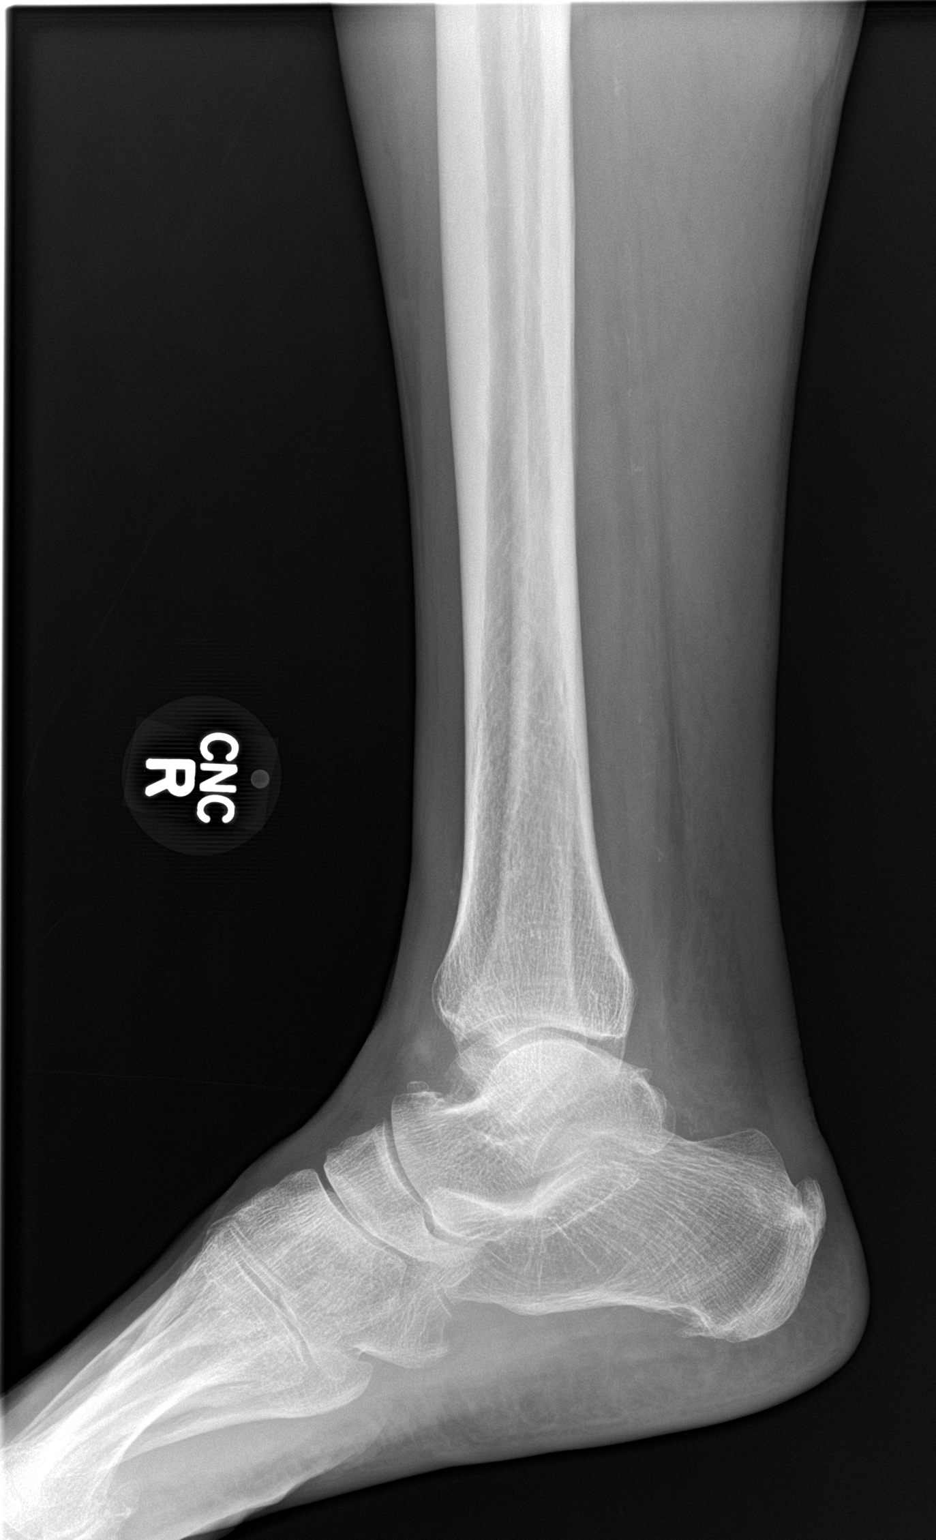

[3 of 3 positions shown; findings below may reference images not displayed]

FINDINGS: No acute bony or joint abnormality. No evidence of fracture. No
evidence of dislocation.
IMPRESSION: No acute abnormality.

## 2021-09-29 ENCOUNTER — Encounter: Payer: Self-pay | Admitting: Ophthalmology

## 2021-09-30 ENCOUNTER — Ambulatory Visit: Payer: Medicare Other | Admitting: Anesthesiology

## 2021-09-30 ENCOUNTER — Ambulatory Visit
Admission: RE | Admit: 2021-09-30 | Discharge: 2021-09-30 | Disposition: A | Discharge status: Home / Self Care | Payer: Medicare Other | Attending: Ophthalmology | Admitting: Ophthalmology

## 2021-09-30 ENCOUNTER — Encounter: Payer: Self-pay | Admitting: Ophthalmology

## 2021-09-30 ENCOUNTER — Encounter
Admission: RE | Disposition: A | Discharge status: Home / Self Care | Payer: Self-pay | Source: Home / Self Care | Attending: Ophthalmology

## 2021-09-30 ENCOUNTER — Other Ambulatory Visit: Payer: Self-pay

## 2021-09-30 DIAGNOSIS — Z87891 Personal history of nicotine dependence: Secondary | ICD-10-CM | POA: Insufficient documentation

## 2021-09-30 DIAGNOSIS — I10 Essential (primary) hypertension: Secondary | ICD-10-CM | POA: Insufficient documentation

## 2021-09-30 DIAGNOSIS — H2511 Age-related nuclear cataract, right eye: Secondary | ICD-10-CM | POA: Diagnosis not present

## 2021-09-30 DIAGNOSIS — E1136 Type 2 diabetes mellitus with diabetic cataract: Secondary | ICD-10-CM | POA: Diagnosis present

## 2021-09-30 HISTORY — PX: CATARACT EXTRACTION W/PHACO: SHX586

## 2021-09-30 LAB — GLUCOSE, CAPILLARY
Glucose-Capillary: 116 mg/dL — ABNORMAL HIGH (ref 70–99)
Glucose-Capillary: 123 mg/dL — ABNORMAL HIGH (ref 70–99)

## 2021-09-30 SURGERY — PHACOEMULSIFICATION, CATARACT, WITH IOL INSERTION
Anesthesia: Monitor Anesthesia Care | Site: Eye | Laterality: Right

## 2021-09-30 MED ORDER — TETRACAINE HCL 0.5 % OP SOLN
1.0000 [drp] | OPHTHALMIC | Status: DC | PRN
  Administered 2021-09-30 (×3): 1 [drp] via OPHTHALMIC

## 2021-09-30 MED ORDER — FENTANYL CITRATE (PF) 100 MCG/2ML IJ SOLN
INTRAMUSCULAR | Status: DC | PRN
  Administered 2021-09-30: 50 ug via INTRAVENOUS

## 2021-09-30 MED ORDER — ONDANSETRON HCL 4 MG/2ML IJ SOLN: 4.0000 mg | Freq: Once | INTRAMUSCULAR | Status: DC | PRN

## 2021-09-30 MED ORDER — ACETAMINOPHEN 160 MG/5ML PO SOLN: 975.0000 mg | Freq: Once | ORAL | Status: DC | PRN

## 2021-09-30 MED ORDER — BRIMONIDINE TARTRATE-TIMOLOL 0.2-0.5 % OP SOLN
OPHTHALMIC | Status: DC | PRN
  Administered 2021-09-30: 1 [drp] via OPHTHALMIC

## 2021-09-30 MED ORDER — SIGHTPATH DOSE#1 BSS IO SOLN
INTRAOCULAR | Status: DC | PRN
  Administered 2021-09-30: 1 mL via INTRAMUSCULAR

## 2021-09-30 MED ORDER — SIGHTPATH DOSE#1 BSS IO SOLN
INTRAOCULAR | Status: DC | PRN
  Administered 2021-09-30: 66 mL via OPHTHALMIC

## 2021-09-30 MED ORDER — SIGHTPATH DOSE#1 BSS IO SOLN
INTRAOCULAR | Status: DC | PRN
  Administered 2021-09-30: 15 mL

## 2021-09-30 MED ORDER — ACETAMINOPHEN 500 MG PO TABS: 1000.0000 mg | ORAL_TABLET | Freq: Once | ORAL | Status: DC | PRN

## 2021-09-30 MED ORDER — SIGHTPATH DOSE#1 NA HYALUR & NA CHOND-NA HYALUR IO KIT
PACK | INTRAOCULAR | Status: DC | PRN
  Administered 2021-09-30: 1 via OPHTHALMIC

## 2021-09-30 MED ORDER — MIDAZOLAM HCL 2 MG/2ML IJ SOLN
INTRAMUSCULAR | Status: DC | PRN
  Administered 2021-09-30: 1 mg via INTRAVENOUS

## 2021-09-30 MED ORDER — CEFUROXIME OPHTHALMIC INJECTION 1 MG/0.1 ML
INJECTION | OPHTHALMIC | Status: DC | PRN
  Administered 2021-09-30: 0.1 mL via INTRACAMERAL

## 2021-09-30 MED ORDER — LACTATED RINGERS IV SOLN: INTRAVENOUS | Status: DC

## 2021-09-30 MED ORDER — ARMC OPHTHALMIC DILATING DROPS
1.0000 "application " | OPHTHALMIC | Status: DC | PRN
  Administered 2021-09-30 (×3): 1 via OPHTHALMIC

## 2021-09-30 SURGICAL SUPPLY — 12 items
CATARACT SUITE SIGHTPATH (MISCELLANEOUS) ×2 IMPLANT
FEE CATARACT SUITE SIGHTPATH (MISCELLANEOUS) ×1 IMPLANT
GLOVE SRG 8 PF TXTR STRL LF DI (GLOVE) ×1 IMPLANT
GLOVE SURG ENC TEXT LTX SZ7.5 (GLOVE) ×2 IMPLANT
GLOVE SURG UNDER POLY LF SZ8 (GLOVE) ×2
LENS IOL DIOP 21.5 (Intraocular Lens) ×2 IMPLANT
LENS IOL TECNIS MONO 21.5 (Intraocular Lens) IMPLANT
NDL FILTER BLUNT 18X1 1/2 (NEEDLE) ×1 IMPLANT
NEEDLE FILTER BLUNT 18X 1/2SAF (NEEDLE) ×1
NEEDLE FILTER BLUNT 18X1 1/2 (NEEDLE) ×1 IMPLANT
SYR 3ML LL SCALE MARK (SYRINGE) ×2 IMPLANT
WATER STERILE IRR 250ML POUR (IV SOLUTION) ×2 IMPLANT

## 2021-09-30 NOTE — Transfer of Care (Signed)
Immediate Anesthesia Transfer of Care Note  Patient: Jaime Castro  Procedure(s) Performed: CATARACT EXTRACTION PHACO AND INTRAOCULAR LENS PLACEMENT (IOC) RIGHT DIABETIC 11.38 01:41.0 (Right: Eye)  Patient Location: PACU  Anesthesia Type: MAC  Level of Consciousness: awake, alert  and patient cooperative  Airway and Oxygen Therapy: Patient Spontanous Breathing and Patient connected to supplemental oxygen  Post-op Assessment: Post-op Vital signs reviewed, Patient's Cardiovascular Status Stable, Respiratory Function Stable, Patent Airway and No signs of Nausea or vomiting  Post-op Vital Signs: Reviewed and stable  Complications: No notable events documented.

## 2021-10-01 ENCOUNTER — Encounter: Payer: Self-pay | Admitting: Ophthalmology
